# Patient Record
Sex: Male | Born: 1985 | Race: Black or African American | Hispanic: No | Marital: Single | Smoking: Never smoker
Health system: Southern US, Community
[De-identification: ages and names within clinical notes are randomized; demographics above are authoritative.]

---

## 2015-03-21 ENCOUNTER — Emergency Department (HOSPITAL_COMMUNITY): Payer: Self-pay

## 2015-03-21 ENCOUNTER — Emergency Department (HOSPITAL_COMMUNITY)
Admission: EM | Admit: 2015-03-21 | Discharge: 2015-03-21 | Disposition: A | Discharge status: Home / Self Care | Payer: Self-pay | Attending: Emergency Medicine | Admitting: Emergency Medicine

## 2015-03-21 ENCOUNTER — Encounter (HOSPITAL_COMMUNITY): Payer: Self-pay | Admitting: Emergency Medicine

## 2015-03-21 DIAGNOSIS — W010XXA Fall on same level from slipping, tripping and stumbling without subsequent striking against object, initial encounter: Secondary | ICD-10-CM | POA: Insufficient documentation

## 2015-03-21 DIAGNOSIS — Y9389 Activity, other specified: Secondary | ICD-10-CM | POA: Insufficient documentation

## 2015-03-21 DIAGNOSIS — Y998 Other external cause status: Secondary | ICD-10-CM | POA: Insufficient documentation

## 2015-03-21 DIAGNOSIS — Y92811 Bus as the place of occurrence of the external cause: Secondary | ICD-10-CM | POA: Insufficient documentation

## 2015-03-21 DIAGNOSIS — Z791 Long term (current) use of non-steroidal anti-inflammatories (NSAID): Secondary | ICD-10-CM | POA: Insufficient documentation

## 2015-03-21 DIAGNOSIS — S8991XA Unspecified injury of right lower leg, initial encounter: Secondary | ICD-10-CM | POA: Insufficient documentation

## 2015-03-21 MED ORDER — IBUPROFEN 800 MG PO TABS: 800.0000 mg | ORAL_TABLET | Freq: Three times a day (TID) | ORAL | Status: AC

## 2015-03-21 MED ORDER — IBUPROFEN 800 MG PO TABS
800.0000 mg | ORAL_TABLET | Freq: Once | ORAL | Status: AC
  Administered 2015-03-21: 800 mg via ORAL
  Filled 2015-03-21: qty 1

## 2015-03-21 NOTE — Discharge Instructions (Signed)
Please read attached information. If you experience any new or worsening signs or symptoms please return to the emergency room for evaluation. Please follow-up with your primary care provider or specialist as discussed. Please use medication prescribed only as directed and discontinue taking if you have any concerning signs or symptoms.   °

## 2015-03-21 NOTE — ED Notes (Signed)
Per pt, states he slipped and fell, now having right knee pain
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
## 2016-10-28 IMAGING — CR DG KNEE COMPLETE 4+V*R*
4 series · 4 of 4 positions shown · non-contrast
Comparison: None.

CLINICAL DATA: Fall with anterior right knee pain and swelling

EXAM:
RIGHT KNEE - COMPLETE 4+ VIEW

[t knee ap right]
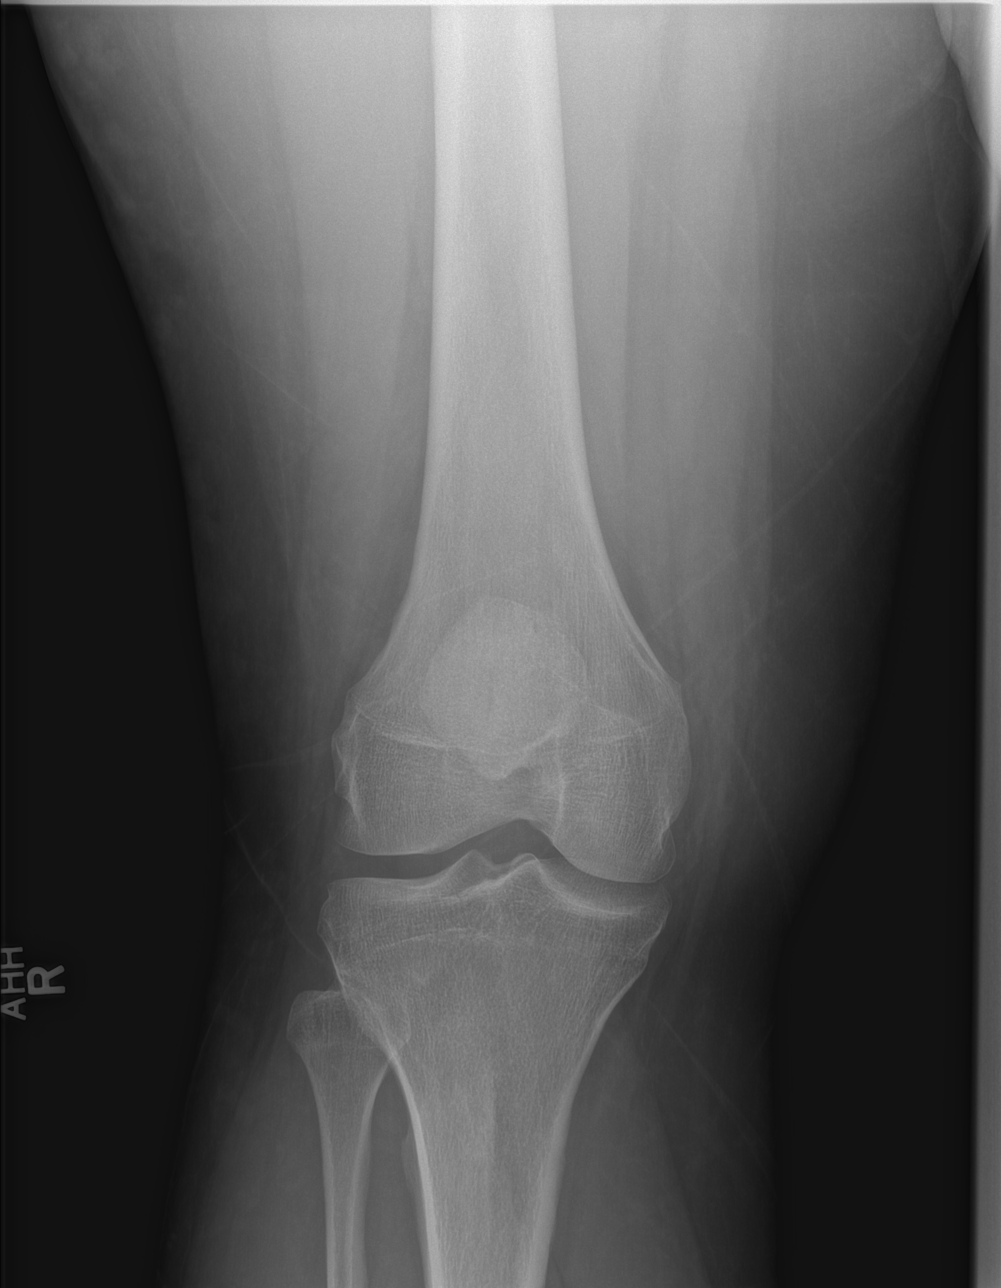

[t knee obl right (1 of 2)]
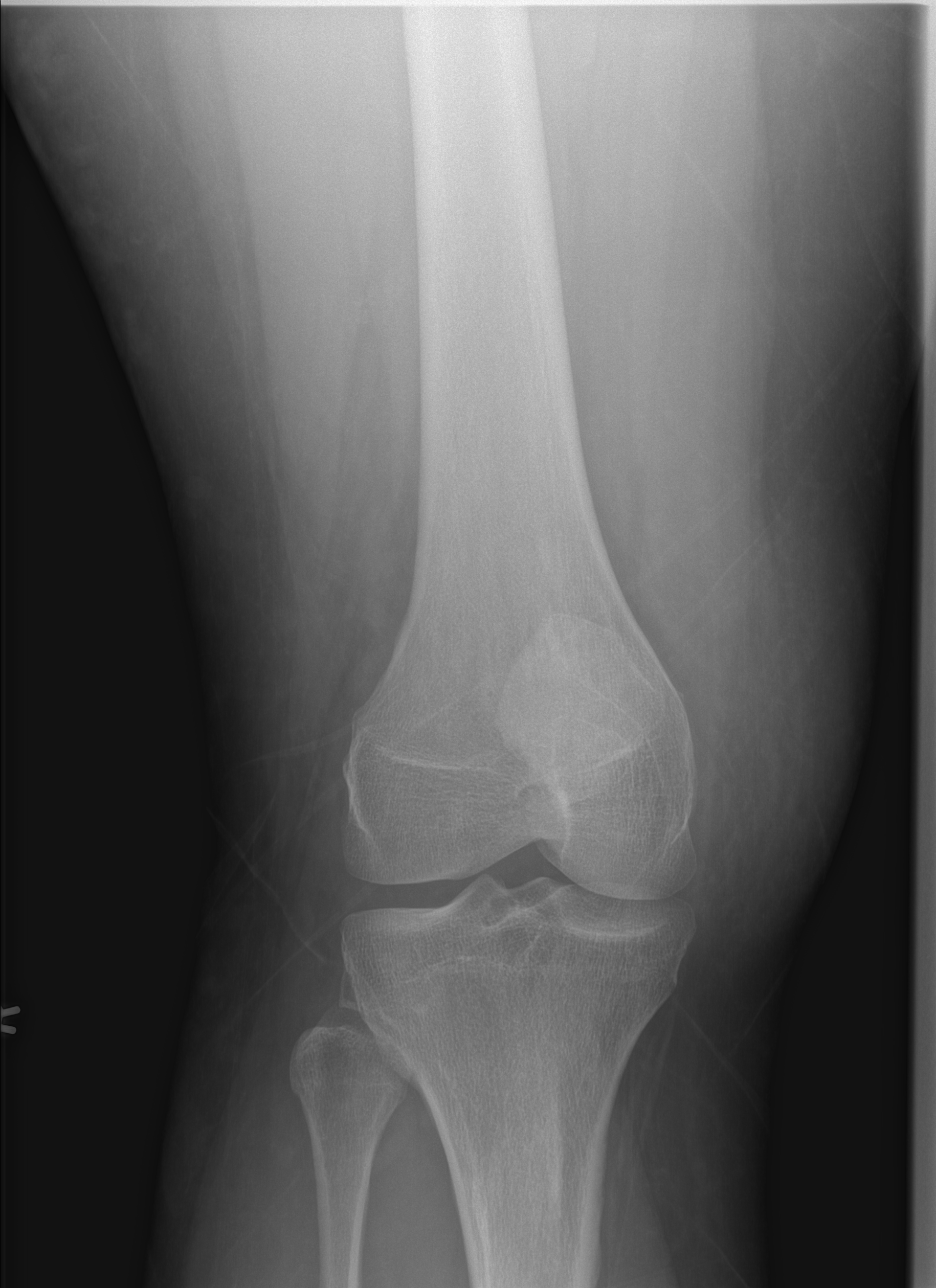

[t knee obl right (2 of 2)]
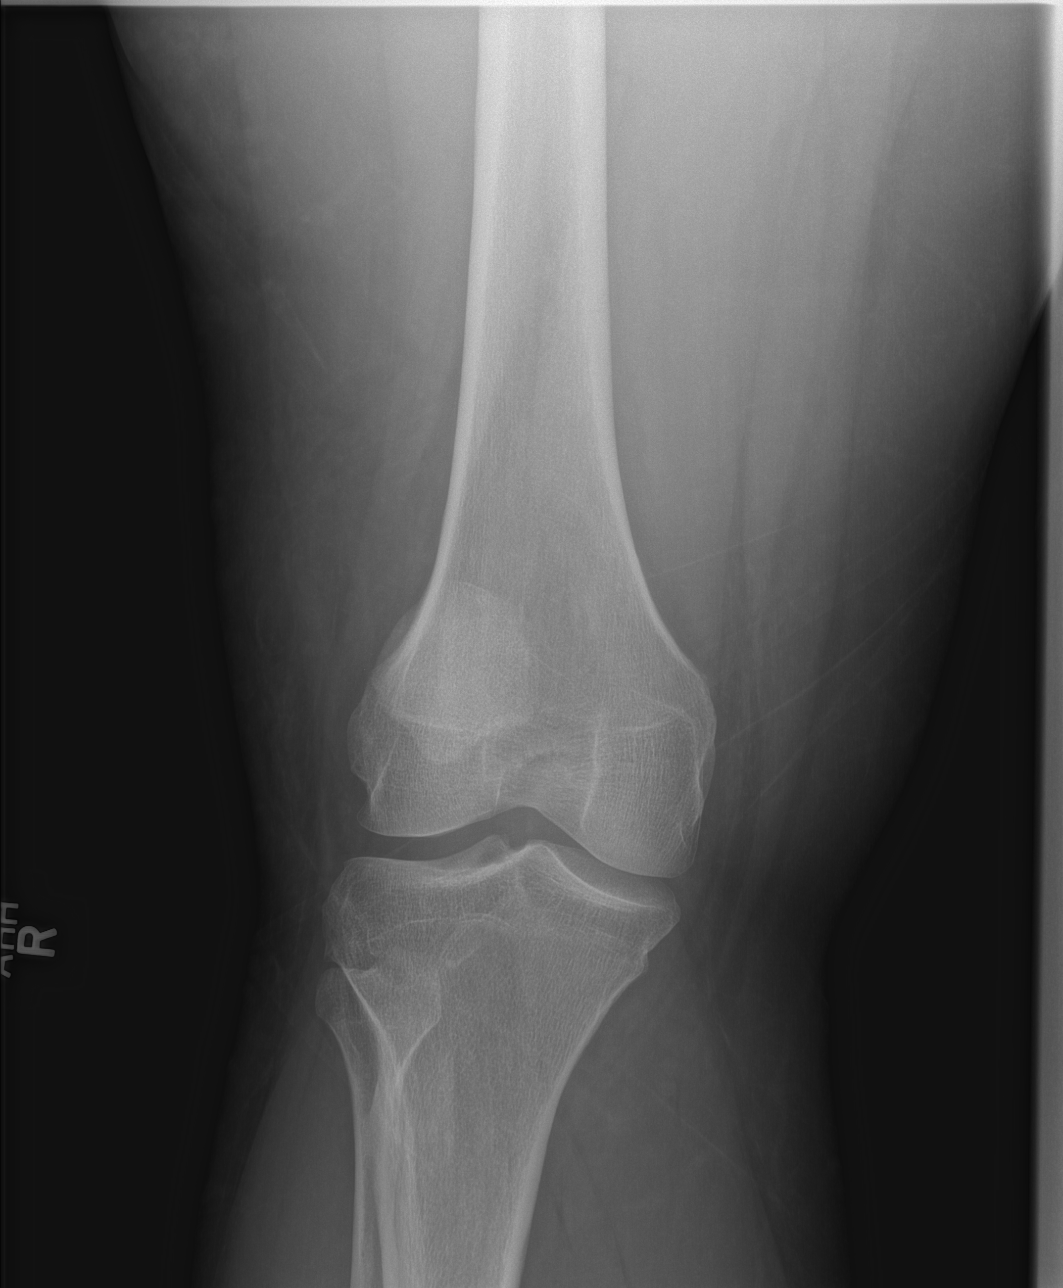

[t knee lat right]
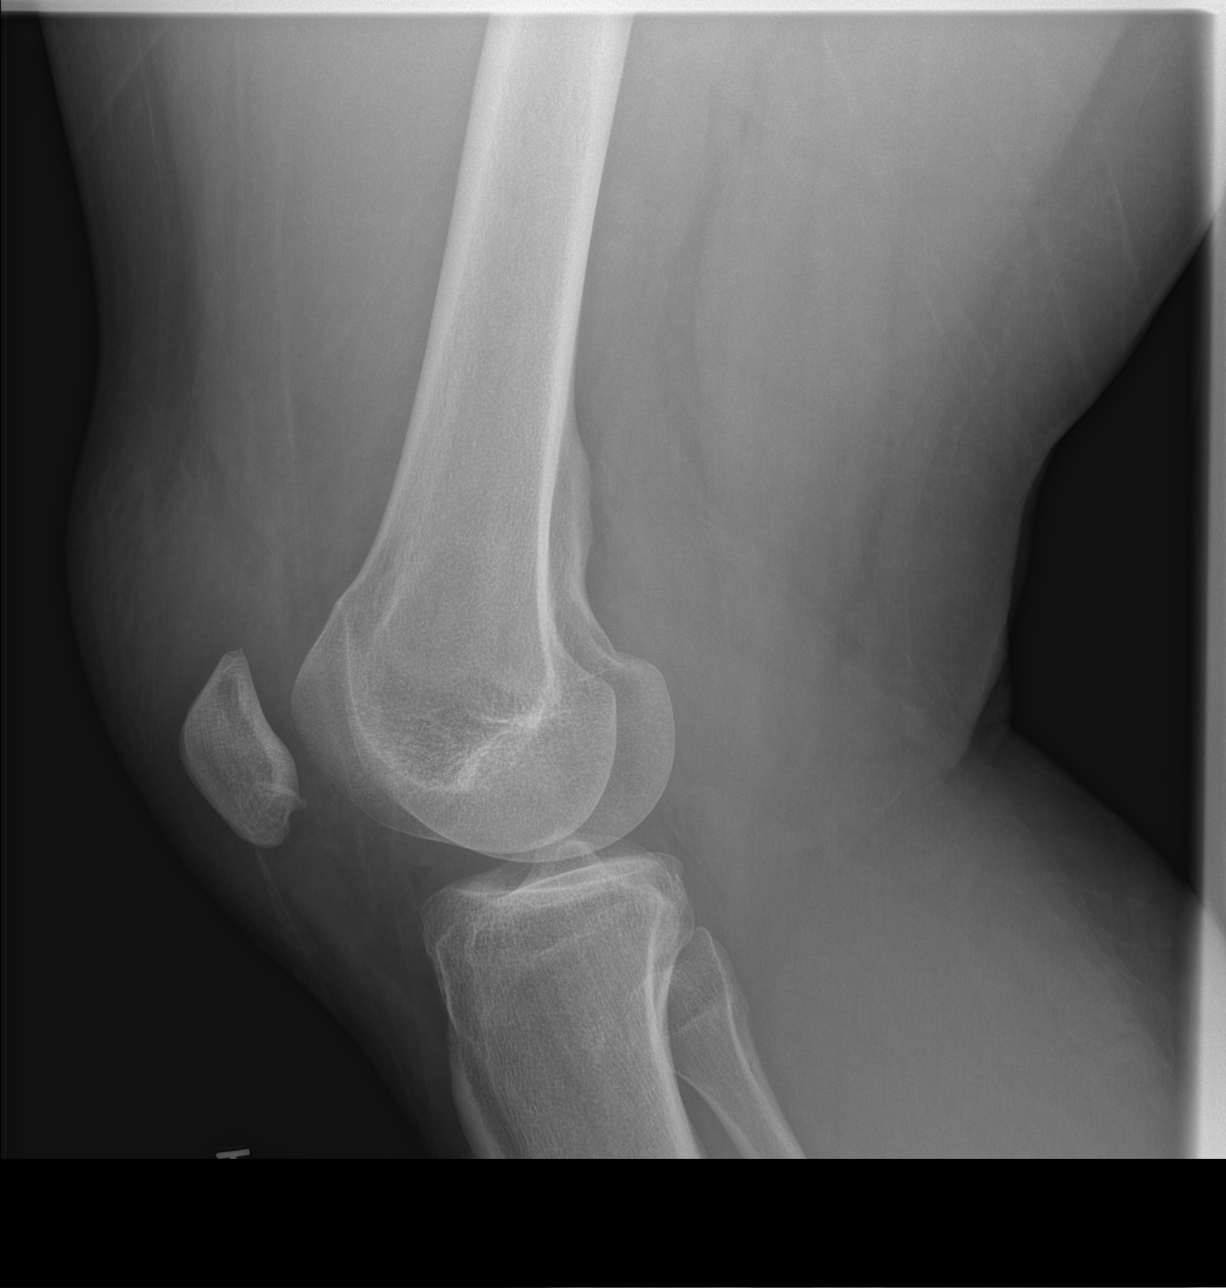

[4 of 4 positions shown; findings below may reference images not displayed]

FINDINGS: There is prominent prepatellar soft tissue swelling. There is
prominent soft tissue swelling in the region of the quadriceps
tendon superior to the right patella. No fracture, joint effusion,
dislocation or suspicious focal osseous lesion. No appreciable
degenerative or erosive arthropathy in the right knee joint. No
pathologic soft tissue calcifications.
IMPRESSION: 1. Prominent soft tissue swelling in the region of the quadriceps
tendon superior to the right patella with prominent prepatellar soft
tissue swelling, cannot exclude a quadriceps tendon injury.
2. No fracture, dislocation or joint effusion in the right knee.
